# Patient Record
Sex: Male | Born: 1970 | Race: Black or African American | Hispanic: No | Smoking: Never smoker
Health system: Southern US, Community
[De-identification: ages and names within clinical notes are randomized; demographics above are authoritative.]

---

## 2014-08-07 ENCOUNTER — Encounter (HOSPITAL_COMMUNITY): Payer: Self-pay | Admitting: Emergency Medicine

## 2014-08-07 ENCOUNTER — Emergency Department (HOSPITAL_COMMUNITY)
Admission: EM | Admit: 2014-08-07 | Discharge: 2014-08-07 | Disposition: A | Discharge status: Home / Self Care | Payer: Self-pay | Attending: Emergency Medicine | Admitting: Emergency Medicine

## 2014-08-07 ENCOUNTER — Emergency Department (HOSPITAL_COMMUNITY): Payer: Self-pay

## 2014-08-07 DIAGNOSIS — R0789 Other chest pain: Secondary | ICD-10-CM | POA: Insufficient documentation

## 2014-08-07 DIAGNOSIS — R079 Chest pain, unspecified: Secondary | ICD-10-CM

## 2014-08-07 DIAGNOSIS — R55 Syncope and collapse: Secondary | ICD-10-CM | POA: Insufficient documentation

## 2014-08-07 LAB — BASIC METABOLIC PANEL
Anion gap: 11 (ref 5–15)
BUN: 11 mg/dL (ref 6–23)
CHLORIDE: 107 mmol/L (ref 96–112)
CO2: 27 mmol/L (ref 19–32)
Calcium: 9.2 mg/dL (ref 8.4–10.5)
Creatinine, Ser: 0.96 mg/dL (ref 0.50–1.35)
GFR calc Af Amer: >90 mL/min (ref >90)
GFR calc non Af Amer: >90 mL/min (ref >90)
Glucose, Bld: 104 mg/dL — ABNORMAL HIGH (ref 70–99)
SODIUM: 145 mmol/L (ref 135–145)

## 2014-08-07 LAB — CBC WITH DIFFERENTIAL/PLATELET
BASOS ABS: 0 10*3/uL (ref 0.0–0.1)
BASOS PCT: 0 % (ref 0–1)
EOS ABS: 0.1 10*3/uL (ref 0.0–0.7)
Eosinophils Relative: 1 % (ref 0–5)
HCT: 45 % (ref 39.0–52.0)
Hemoglobin: 14.9 g/dL (ref 13.0–17.0)
LYMPHS ABS: 1.6 10*3/uL (ref 0.7–4.0)
Lymphocytes Relative: 21 % (ref 12–46)
MCH: 28.4 pg (ref 26.0–34.0)
MCHC: 33.1 g/dL (ref 30.0–36.0)
MCV: 85.7 fL (ref 78.0–100.0)
MONO ABS: 0.3 10*3/uL (ref 0.1–1.0)
Monocytes Relative: 4 % (ref 3–12)
Neutro Abs: 5.7 10*3/uL (ref 1.7–7.7)
Neutrophils Relative %: 74 % (ref 43–77)
Platelets: 321 10*3/uL (ref 150–400)
RBC: 5.25 MIL/uL (ref 4.22–5.81)
RDW: 13.9 % (ref 11.5–15.5)
WBC: 7.7 10*3/uL (ref 4.0–10.5)

## 2014-08-07 LAB — I-STAT TROPONIN, ED
Troponin i, poc: 0.01 ng/mL (ref 0.00–0.08)
Troponin i, poc: 0 ng/mL (ref 0.00–0.08)

## 2014-08-07 LAB — I-STAT CHEM 8, ED
BUN: 10 mg/dL (ref 6–23)
CALCIUM ION: 1.13 mmol/L (ref 1.12–1.23)
CHLORIDE: 103 mmol/L (ref 96–112)
CREATININE: 1.3 mg/dL (ref 0.50–1.35)
Glucose, Bld: 110 mg/dL — ABNORMAL HIGH (ref 70–99)
HCT: 50 % (ref 39.0–52.0)
HEMOGLOBIN: 17 g/dL (ref 13.0–17.0)
Potassium: 3.6 mmol/L (ref 3.5–5.1)
SODIUM: 143 mmol/L (ref 135–145)
TCO2: 23 mmol/L (ref 0–100)

## 2014-08-07 LAB — CBG MONITORING, ED: Glucose-Capillary: 115 mg/dL — ABNORMAL HIGH (ref 70–99)

## 2014-08-07 LAB — MAGNESIUM: Magnesium: 2.1 mg/dL (ref 1.5–2.5)

## 2014-08-07 MED ORDER — SODIUM CHLORIDE 0.9 % IV BOLUS (SEPSIS)
1000.0000 mL | Freq: Once | INTRAVENOUS | Status: AC
Start: 2014-08-07 — End: 2014-08-07
  Administered 2014-08-07: 1000 mL via INTRAVENOUS

## 2014-08-07 NOTE — ED Notes (Signed)
Patient transported to X-ray 

## 2014-08-07 NOTE — ED Notes (Signed)
Pt c/o dizziness, lightheadedness, diaphoresis. Denies syncope.

## 2014-08-07 NOTE — Discharge Instructions (Signed)
Chest Pain (Nonspecific) °It is often hard to give a specific diagnosis for the cause of chest pain. There is always a chance that your pain could be related to something serious, such as a heart attack or a blood clot in the lungs. You need to follow up with your health care provider for further evaluation. °CAUSES  °· Heartburn. °· Pneumonia or bronchitis. °· Anxiety or stress. °· Inflammation around your heart (pericarditis) or lung (pleuritis or pleurisy). °· A blood clot in the lung. °· A collapsed lung (pneumothorax). It can develop suddenly on its own (spontaneous pneumothorax) or from trauma to the chest. °· Shingles infection (herpes zoster virus). °The chest wall is composed of bones, muscles, and cartilage. Any of these can be the source of the pain. °· The bones can be bruised by injury. °· The muscles or cartilage can be strained by coughing or overwork. °· The cartilage can be affected by inflammation and become sore (costochondritis). °DIAGNOSIS  °Lab tests or other studies may be needed to find the cause of your pain. Your health care provider may have you take a test called an ambulatory electrocardiogram (ECG). An ECG records your heartbeat patterns over a 24-hour period. You may also have other tests, such as: °· Transthoracic echocardiogram (TTE). During echocardiography, sound waves are used to evaluate how blood flows through your heart. °· Transesophageal echocardiogram (TEE). °· Cardiac monitoring. This allows your health care provider to monitor your heart rate and rhythm in real time. °· Holter monitor. This is a portable device that records your heartbeat and can help diagnose heart arrhythmias. It allows your health care provider to track your heart activity for several days, if needed. °· Stress tests by exercise or by giving medicine that makes the heart beat faster. °TREATMENT  °· Treatment depends on what may be causing your chest pain. Treatment may include: °¨ Acid blockers for  heartburn. °¨ Anti-inflammatory medicine. °¨ Pain medicine for inflammatory conditions. °¨ Antibiotics if an infection is present. °· You may be advised to change lifestyle habits. This includes stopping smoking and avoiding alcohol, caffeine, and chocolate. °· You may be advised to keep your head raised (elevated) when sleeping. This reduces the chance of acid going backward from your stomach into your esophagus. °Most of the time, nonspecific chest pain will improve within 2-3 days with rest and mild pain medicine.  °HOME CARE INSTRUCTIONS  °· If antibiotics were prescribed, take them as directed. Finish them even if you start to feel better. °· For the next few days, avoid physical activities that bring on chest pain. Continue physical activities as directed. °· Do not use any tobacco products, including cigarettes, chewing tobacco, or electronic cigarettes. °· Avoid drinking alcohol. °· Only take medicine as directed by your health care provider. °· Follow your health care provider's suggestions for further testing if your chest pain does not go away. °· Keep any follow-up appointments you made. If you do not go to an appointment, you could develop lasting (chronic) problems with pain. If there is any problem keeping an appointment, call to reschedule. °SEEK MEDICAL CARE IF:  °· Your chest pain does not go away, even after treatment. °· You have a rash with blisters on your chest. °· You have a fever. °SEEK IMMEDIATE MEDICAL CARE IF:  °· You have increased chest pain or pain that spreads to your arm, neck, jaw, back, or abdomen. °· You have shortness of breath. °· You have an increasing cough, or you cough   up blood. °· You have severe back or abdominal pain. °· You feel nauseous or vomit. °· You have severe weakness. °· You faint. °· You have chills. °This is an emergency. Do not wait to see if the pain will go away. Get medical help at once. Call your local emergency services (911 in [place] Do not drive  yourself to the hospital. °MAKE SURE YOU:  °· Understand these instructions. °· Will watch your condition. °· Will get help right away if you are not doing well or get worse. °Document Released: 04/02/2005 Document Revised: 06/28/2013 Document Reviewed: 01/27/2008 °[employer]® Patient Information ©2015 [employer]. This information is not intended to replace advice given to you by your health care provider. Make sure you discuss any questions you have with your health care provider. ° °Near-Syncope °Near-syncope (commonly known as near fainting) is sudden weakness, dizziness, or feeling like you might pass out. During an episode of near-syncope, you may also develop pale skin, have tunnel vision, or feel sick to your stomach (nauseous). Near-syncope may occur when getting up after sitting or while standing for a long time. It is caused by a sudden decrease in blood flow to the brain. This decrease can result from various causes or triggers, most of which are not serious. However, because near-syncope can sometimes be a sign of something serious, a medical evaluation is required. The specific cause is often not determined. °HOME CARE INSTRUCTIONS  °Monitor your condition for any changes. The following actions may help to alleviate any discomfort you are experiencing: °· Have someone stay with you until you feel stable. °· Lie down right away and prop your feet up if you start feeling like you might faint. Breathe deeply and steadily. Wait until all the symptoms have passed. Most of these episodes last only a few minutes. You may feel tired for several hours.   °· Drink enough fluids to keep your urine clear or pale yellow.   °· If you are taking blood pressure or heart medicine, get up slowly when seated or lying down. Take several minutes to sit and then stand. This can reduce dizziness. °· Follow up with your health care provider as directed.  °SEEK IMMEDIATE MEDICAL CARE IF:  °· You have a severe headache.    °· You have unusual pain in the chest, abdomen, or back.   °· You are bleeding from the mouth or rectum, or you have black or tarry stool.   °· You have an irregular or very fast heartbeat.   °· You have repeated fainting or have seizure-like jerking during an episode.   °· You faint when sitting or lying down.   °· You have confusion.   °· You have difficulty walking.   °· You have severe weakness.   °· You have vision problems.   °MAKE SURE YOU:  °· Understand these instructions. °· Will watch your condition. °· Will get help right away if you are not doing well or get worse. °Document Released: 06/23/2005 Document Revised: 06/28/2013 Document Reviewed: 11/26/2012 °Exit[employer]® Patient Information ©2015 [employer]. This information is not intended to replace advice given to you by your health care provider. Make sure you discuss any questions you have with your health care provider. ° °

## 2014-08-07 NOTE — ED Provider Notes (Signed)
TIME SEEN: 5:20 PM  CHIEF COMPLAINT: Lightheadedness, diaphoresis, chest pain, near-syncope  HPI: Pt is a 44 y.o. male with no significant past medical history who presents to the emergency department with complaints of episode of feeling very lightheaded while at work and then developed diaphoresis, sharp chest pain, chest tightness and feeling he was on a pass out but did not pass out. Denies shortness of breath, no nausea, vomiting or diarrhea. No recent bloody stool or melena. States he is feeling normal nail. Has had this happen to him twice before. He states he has had a syncopal event in the past. Does not have a PCP or cardiologist that he follows up with. No history of PE or DVT, prolonged immobilization such as long flight or hospitals physician, fracture, surgery, trauma. No history of hypertension, diabetes, hyperlipidemia, tobacco use. No family history of premature CAD. Does have a grandfather that had an MI in his 7470s.  ROS: See HPI Constitutional: no fever  Eyes: no drainage  ENT: no runny nose   Cardiovascular:  no chest pain  Resp: no SOB  GI: no vomiting GU: no dysuria Integumentary: no rash  Allergy: no hives  Musculoskeletal: no leg swelling  Neurological: no slurred speech ROS otherwise negative  PAST MEDICAL HISTORY/PAST SURGICAL HISTORY:  History reviewed. No pertinent past medical history.  MEDICATIONS:  Prior to Admission medications   Not on File    ALLERGIES:  No Known Allergies  SOCIAL HISTORY:  History  Substance Use Topics  . Smoking status: Never Smoker   . Smokeless tobacco: Not on file  . Alcohol Use: 1.2 oz/week    2 Cans of beer per week    FAMILY HISTORY: History reviewed. No pertinent family history.  EXAM: BP 122/75 mmHg  Pulse 79  Temp(Src) 97.7 F (36.5 C) (Oral)  Resp 16  SpO2 96% CONSTITUTIONAL: Alert and oriented and responds appropriately to questions. Well-appearing; well-nourished HEAD: Normocephalic EYES: Conjunctivae  clear, PERRL ENT: normal nose; no rhinorrhea; moist mucous membranes; pharynx without lesions noted NECK: Supple, no meningismus, no LAD  CARD: RRR; S1 and S2 appreciated; no murmurs, no clicks, no rubs, no gallops RESP: Normal chest excursion without splinting or tachypnea; breath sounds clear and equal bilaterally; no wheezes, no rhonchi, no rales, no hypoxia or respiratory distress ABD/GI: Normal bowel sounds; non-distended; soft, non-tender, no rebound, no guarding BACK:  The back appears normal and is non-tender to palpation, there is no CVA tenderness EXT: Normal ROM in all joints; non-tender to palpation; no edema; normal capillary refill; no cyanosis, no calf tenderness or swelling SKIN: Normal color for age and race; warm NEURO: Moves all extremities equally; sensation to light touch intact diffusely, cranial nerves II through XII intact, normal gait PSYCH: The patient's mood and manner are appropriate. Grooming and personal hygiene are appropriate.  MEDICAL DECISION MAKING: Patient here with near syncopal event. He has no risk factors for ACS other than a grandfather who had an MI in his 5670s. No risk factors for pulmonary embolus and is PERC negative.  We'll obtain basic labs, troponin, EKG, chest x-ray. We'll check orthostatic vital signs and give IV fluids.  ED PROGRESS: Patient's labs are unremarkable. Orthostatic vital signs negative. Reports feeling better after IV fluids. No further chest pain while in the emergency department. Has had 2 negative troponins. Has no risk factors for ACS but does have an EKG that has T-wave inversions in inferior leads with no old for comparison. Have recommended close outpatient follow-up with cardiology.  Discussed return options. He verbalizes understanding and is comfortable with plan. We'll discharge with cardiology follow-up information. Have advised him to make an appointment.      EKG Interpretation  Date/Time:  Monday August 07 2014  15:39:44 EST Ventricular Rate:  78 PR Interval:  140 QRS Duration: 81 QT Interval:  364 QTC Calculation: 415 R Axis:   41 Text Interpretation:  Sinus rhythm Borderline T abnormalities, inferior leads No old tracing to compare Confirmed by WARD,  DO, KRISTEN [phone]) on 08/07/2014 3:47:51 PM         Layla Maw Ward, DO 08/07/14 2023

## 2014-08-08 ENCOUNTER — Other Ambulatory Visit (HOSPITAL_COMMUNITY): Payer: Self-pay | Admitting: *Deleted

## 2015-01-04 ENCOUNTER — Emergency Department (HOSPITAL_COMMUNITY)
Admission: EM | Admit: 2015-01-04 | Discharge: 2015-01-05 | Disposition: A | Discharge status: Home / Self Care | Payer: Self-pay | Attending: Emergency Medicine | Admitting: Emergency Medicine

## 2015-01-04 ENCOUNTER — Emergency Department (HOSPITAL_COMMUNITY): Payer: Self-pay

## 2015-01-04 ENCOUNTER — Encounter (HOSPITAL_COMMUNITY): Payer: Self-pay | Admitting: Emergency Medicine

## 2015-01-04 DIAGNOSIS — Z79899 Other long term (current) drug therapy: Secondary | ICD-10-CM | POA: Insufficient documentation

## 2015-01-04 DIAGNOSIS — J069 Acute upper respiratory infection, unspecified: Secondary | ICD-10-CM | POA: Insufficient documentation

## 2015-01-04 DIAGNOSIS — R197 Diarrhea, unspecified: Secondary | ICD-10-CM | POA: Insufficient documentation

## 2015-01-04 DIAGNOSIS — R1110 Vomiting, unspecified: Secondary | ICD-10-CM | POA: Insufficient documentation

## 2015-01-04 LAB — BASIC METABOLIC PANEL
Anion gap: 9 (ref 5–15)
BUN: 7 mg/dL (ref 6–20)
CALCIUM: 9.7 mg/dL (ref 8.9–10.3)
CHLORIDE: 101 mmol/L (ref 101–111)
CO2: 28 mmol/L (ref 22–32)
Creatinine, Ser: 0.95 mg/dL (ref 0.61–1.24)
Glucose, Bld: 129 mg/dL — ABNORMAL HIGH (ref 65–99)
POTASSIUM: 4.3 mmol/L (ref 3.5–5.1)
Sodium: 138 mmol/L (ref 135–145)

## 2015-01-04 LAB — CBC WITH DIFFERENTIAL/PLATELET
Basophils Absolute: 0 10*3/uL (ref 0.0–0.1)
Basophils Relative: 0 % (ref 0–1)
EOS ABS: 0.2 10*3/uL (ref 0.0–0.7)
EOS PCT: 2 % (ref 0–5)
HCT: 47.6 % (ref 39.0–52.0)
HEMOGLOBIN: 15.6 g/dL (ref 13.0–17.0)
Lymphocytes Relative: 20 % (ref 12–46)
Lymphs Abs: 1.7 10*3/uL (ref 0.7–4.0)
MCH: 27.8 pg (ref 26.0–34.0)
MCHC: 32.8 g/dL (ref 30.0–36.0)
MCV: 84.8 fL (ref 78.0–100.0)
MONO ABS: 0.6 10*3/uL (ref 0.1–1.0)
Monocytes Relative: 6 % (ref 3–12)
NEUTROS ABS: 6.2 10*3/uL (ref 1.7–7.7)
Neutrophils Relative %: 72 % (ref 43–77)
Platelets: 289 10*3/uL (ref 150–400)
RBC: 5.61 MIL/uL (ref 4.22–5.81)
RDW: 13.6 % (ref 11.5–15.5)
WBC: 8.7 10*3/uL (ref 4.0–10.5)

## 2015-01-04 MED ORDER — CETIRIZINE HCL 10 MG PO TABS: 10.0000 mg | ORAL_TABLET | Freq: Every day | ORAL | Status: AC

## 2015-01-04 MED ORDER — FLUTICASONE PROPIONATE 50 MCG/ACT NA SUSP: 2.0000 | Freq: Every day | NASAL | Status: AC

## 2015-01-04 MED ORDER — ALBUTEROL SULFATE HFA 108 (90 BASE) MCG/ACT IN AERS
2.0000 | INHALATION_SPRAY | Freq: Once | RESPIRATORY_TRACT | Status: AC
  Administered 2015-01-04: 2 via RESPIRATORY_TRACT
  Filled 2015-01-04: qty 6.7

## 2015-01-04 MED ORDER — ACETAMINOPHEN 325 MG PO TABS
650.0000 mg | ORAL_TABLET | Freq: Once | ORAL | Status: AC
  Administered 2015-01-04: 650 mg via ORAL
  Filled 2015-01-04: qty 2

## 2015-01-04 MED ORDER — NAPROXEN 250 MG PO TABS: 250.0000 mg | ORAL_TABLET | Freq: Two times a day (BID) | ORAL | Status: AC

## 2015-01-04 MED ORDER — BENZONATATE 100 MG PO CAPS: 100.0000 mg | ORAL_CAPSULE | Freq: Three times a day (TID) | ORAL | Status: DC

## 2015-01-04 NOTE — ED Notes (Signed)
PA at bedside.

## 2015-01-04 NOTE — ED Provider Notes (Signed)
CSN: [id number]     Arrival date & time 01/04/15  1823 History   First MD Initiated Contact with Patient 01/04/15 2105     Chief Complaint  Patient presents with  . Shortness of Breath  . Cough  . Emesis   Nathan Garrett is a 44 y.o. male who is otherwise healthy who presents to the ED complaining of a cough ongoing for the past 3 days associated with mild shortness of breath. Patient reports productive cough with clear sputum. He reports associated nasal congestion, postnasal drip, runny nose, and sneezing.  He reports 3 episodes of posttussive emesis today. He also reports one episode of watery diarrhea this morning but none since. He denies abdominal pain. He reports is a headache that he rates a the 5 out of 10 and is worse with coughing. He reports taking DayQuil this morning with little relief. The patient denies history of COPD, chronic bronchitis or emphysema. The patient is not a smoker. The patient denies fevers, history of asthma, abdominal pain, nausea, hematemesis, hemoptysis, changes to his vision, rashes, urinary symptoms, sick contacts, ear pain, eye pain, hematochezia, chest pain or palpitations.   (Consider location/radiation/quality/duration/timing/severity/associated sxs/prior Treatment) HPI  History reviewed. No pertinent past medical history. History reviewed. No pertinent past surgical history. History reviewed. No pertinent family history. History  Substance Use Topics  . Smoking status: Never Smoker   . Smokeless tobacco: Not on file  . Alcohol Use: 1.2 oz/week    2 Cans of beer per week    Review of Systems  Constitutional: Positive for chills. Negative for fever.  HENT: Positive for rhinorrhea, sinus pressure and sneezing. Negative for congestion, ear pain, mouth sores, sore throat and trouble swallowing.   Eyes: Negative for pain and visual disturbance.  Respiratory: Positive for cough and shortness of breath. Negative for wheezing.   Cardiovascular:  Negative for chest pain, palpitations and leg swelling.  Gastrointestinal: Positive for vomiting and diarrhea. Negative for nausea, abdominal pain and blood in stool.  Genitourinary: Negative for dysuria, urgency, frequency, hematuria and difficulty urinating.  Musculoskeletal: Negative for myalgias, back pain and neck pain.  Skin: Negative for rash.  Neurological: Positive for headaches. Negative for dizziness, syncope, weakness, light-headedness and numbness.      Allergies  Review of patient's allergies indicates no known allergies.  Home Medications   Prior to Admission medications   Medication Sig Start Date End Date Taking? Authorizing Provider  albuterol (PROVENTIL HFA;VENTOLIN HFA) 108 (90 BASE) MCG/ACT inhaler Inhale 1-2 puffs into the lungs every 6 (six) hours as needed for wheezing or shortness of breath.   Yes Historical Provider, MD  Dextromethorphan HBr (VICKS DAYQUIL COUGH) 15 MG/15ML LIQD Take 30 mLs by mouth every 6 (six) hours as needed (cough).   Yes Historical Provider, MD  benzonatate (TESSALON) 100 MG capsule Take 1 capsule (100 mg total) by mouth every 8 (eight) hours. 01/04/15   Everlene Farrier, PA-C  cetirizine (ZYRTEC ALLERGY) 10 MG tablet Take 1 tablet (10 mg total) by mouth daily. 01/04/15   Everlene Farrier, PA-C  fluticasone (FLONASE) 50 MCG/ACT nasal spray Place 2 sprays into both nostrils daily. 01/04/15   Everlene Farrier, PA-C  naproxen (NAPROSYN) 250 MG tablet Take 1 tablet (250 mg total) by mouth 2 (two) times daily with a meal. 01/04/15   Everlene Farrier, PA-C   BP 155/98 mmHg  Pulse 64  Temp(Src) 98 F (36.7 C) (Oral)  Resp 18  SpO2 99% Physical Exam  Constitutional: He appears well-developed and  well-nourished. No distress.  Nontoxic appearing.  HENT:  Head: Normocephalic and atraumatic.  Right Ear: External ear normal.  Left Ear: External ear normal.  Nose: Nose normal.  Mouth/Throat: Oropharynx is clear and moist. No oropharyngeal exudate.   Bilateral tympanic membranes are pearly-gray without erythema or loss of landmarks. Mild restrictive oropharyngeal erythema. No tonsillar hypertrophy or exudates. Mild bilateral frontal sinus tenderness to palpation. No maxillary sinus tenderness.  Eyes: Conjunctivae are normal. Pupils are equal, round, and reactive to light. Right eye exhibits no discharge. Left eye exhibits no discharge.  Neck: Normal range of motion. Neck supple. No JVD present. No tracheal deviation present.  Cardiovascular: Normal rate, regular rhythm, normal heart sounds and intact distal pulses.  Exam reveals no gallop and no friction rub.   No murmur heard. Bilateral radial, posterior tibialis and dorsalis pedis pulses are intact.    Pulmonary/Chest: Effort normal and breath sounds normal. No respiratory distress. He has no wheezes. He has no rales. He exhibits no tenderness.  Lungs are clear to auscultation bilaterally.  Abdominal: Soft. Bowel sounds are normal. He exhibits no distension. There is no tenderness. There is no rebound and no guarding.  Abdomen is soft and nontender to palpation.  Musculoskeletal: He exhibits no edema or tenderness.  No lower extremity edema or tenderness.  Lymphadenopathy:    He has no cervical adenopathy.  Neurological: He is alert. Coordination normal.  Skin: Skin is warm and dry. No rash noted. He is not diaphoretic. No erythema. No pallor.  Psychiatric: He has a normal mood and affect. His behavior is normal.  Nursing note and vitals reviewed.   ED Course  Procedures (including critical care time) Labs Review Labs Reviewed  BASIC METABOLIC PANEL - Abnormal; Notable for the following:    Glucose, Bld 129 (*)    All other components within normal limits  CBC WITH DIFFERENTIAL/PLATELET    Imaging Review Dg Chest 2 View  01/04/2015   CLINICAL DATA:  Cough and shortness of breath  EXAM: CHEST  2 VIEW  COMPARISON:  None.  FINDINGS: The heart size and mediastinal contours are  within normal limits. Both lungs are clear. The visualized skeletal structures are unremarkable.  IMPRESSION: No active cardiopulmonary disease.   Electronically Signed   By: Signa Kell M.D.   On: 01/04/2015 21:28     EKG Interpretation   Date/Time:  Thursday January 04 2015 18:32:43 EDT Ventricular Rate:  78 PR Interval:  115 QRS Duration: 82 QT Interval:  357 QTC Calculation: 407 R Axis:   27 Text Interpretation:  Sinus rhythm Borderline short PR interval Abnormal  T, consider ischemia, diffuse leads Baseline wander in lead(s) I II III  aVL aVF artifact limits evaluation Confirmed by Bebe Shaggy  MD, DONALD  [phone]) on 01/04/2015 8:16:58 PM     Filed Vitals:   01/04/15 1830 01/04/15 2108 01/04/15 2231  BP: 161/96 163/107 155/98  Pulse: 98 71 64  Temp: 98 F (36.7 C) 97.9 F (36.6 C) 98 F (36.7 C)  TempSrc: Oral Oral Oral  Resp: SpO2: 99% 99% 99%    MDM   Meds given in ED:  Medications  albuterol (PROVENTIL HFA;VENTOLIN HFA) 108 (90 BASE) MCG/ACT inhaler 2 puff (2 puffs Inhalation Given 01/04/15 2158)  acetaminophen (TYLENOL) tablet 650 mg (650 mg Oral Given 01/04/15 2157)    New Prescriptions   BENZONATATE (TESSALON) 100 MG CAPSULE    Take 1 capsule (100 mg total) by mouth every 8 (eight) hours.  CETIRIZINE (ZYRTEC ALLERGY) 10 MG TABLET    Take 1 tablet (10 mg total) by mouth daily.   FLUTICASONE (FLONASE) 50 MCG/ACT NASAL SPRAY    Place 2 sprays into both nostrils daily.   NAPROXEN (NAPROSYN) 250 MG TABLET    Take 1 tablet (250 mg total) by mouth 2 (two) times daily with a meal.    Final diagnoses:  Upper respiratory infection   Pt CXR negative for acute infiltrate. Patients symptoms are consistent with URI, likely viral etiology. Discussed that antibiotics are not indicated for viral infections. Pt will be discharged with symptomatic treatment.  Patient was given albuterol inhaler in the ED and Tylenol and he reports feeling much better at discharge. He  reports feeling ready to be discharged. Verbalizes understanding and is agreeable with plan. Pt is hemodynamically stable & in NAD prior to dc. I advised the patient to follow-up with their primary care provider this week. I advised the patient to return to the emergency department with new or worsening symptoms or new concerns. The patient verbalized understanding and agreement with plan.    Everlene FarrierWilliam Nahum Sherrer, PA-C 01/05/15 [phone]  Blake DivineJohn Wofford, MD 01/05/15 [phone]

## 2015-01-04 NOTE — ED Notes (Addendum)
Pt reports coughing with associated SOB x2 days. Reports clear sputum. Had a "cold last month" -thought this had resolved. Emesis x2 days. Unsure if he has had a fever. Endorses diarrhea. No other c/c. Has tried Dayquil and Nyquil with no alleviation. Took Dayquil today around 1200.

## 2015-01-04 NOTE — Discharge Instructions (Signed)
Upper Respiratory Infection, Adult °An upper respiratory infection (URI) is also sometimes known as the common cold. The upper respiratory tract includes the nose, sinuses, throat, trachea, and bronchi. Bronchi are the airways leading to the lungs. Most people improve within 1 week, but symptoms can last up to 2 weeks. A residual cough may last even longer.  °CAUSES °Many different viruses can infect the tissues lining the upper respiratory tract. The tissues become irritated and inflamed and often become very moist. Mucus production is also common. A cold is contagious. You can easily spread the virus to others by oral contact. This includes kissing, sharing a glass, coughing, or sneezing. Touching your mouth or nose and then touching a surface, which is then touched by another person, can also spread the virus. °SYMPTOMS  °Symptoms typically develop 1 to 3 days after you come in contact with a cold virus. Symptoms vary from person to person. They may include: °· Runny nose. °· Sneezing. °· Nasal congestion. °· Sinus irritation. °· Sore throat. °· Loss of voice (laryngitis). °· Cough. °· Fatigue. °· Muscle aches. °· Loss of appetite. °· Headache. °· Low-grade fever. °DIAGNOSIS  °You might diagnose your own cold based on familiar symptoms, since most people get a cold 2 to 3 times a year. Your caregiver can confirm this based on your exam. Most importantly, your caregiver can check that your symptoms are not due to another disease such as strep throat, sinusitis, pneumonia, asthma, or epiglottitis. Blood tests, throat tests, and X-rays are not necessary to diagnose a common cold, but they may sometimes be helpful in excluding other more serious diseases. Your caregiver will decide if any further tests are required. °RISKS AND COMPLICATIONS  °You may be at risk for a more severe case of the common cold if you smoke cigarettes, have chronic heart disease (such as heart failure) or lung disease (such as asthma), or if  you have a weakened immune system. The very young and very old are also at risk for more serious infections. Bacterial sinusitis, middle ear infections, and bacterial pneumonia can complicate the common cold. The common cold can worsen asthma and chronic obstructive pulmonary disease (COPD). Sometimes, these complications can require emergency medical care and may be life-threatening. °PREVENTION  °The best way to protect against getting a cold is to practice good hygiene. Avoid oral or hand contact with people with cold symptoms. Wash your hands often if contact occurs. There is no clear evidence that vitamin C, vitamin E, echinacea, or exercise reduces the chance of developing a cold. However, it is always recommended to get plenty of rest and practice good nutrition. °TREATMENT  °Treatment is directed at relieving symptoms. There is no cure. Antibiotics are not effective, because the infection is caused by a virus, not by bacteria. Treatment may include: °· Increased fluid intake. Sports drinks offer valuable electrolytes, sugars, and fluids. °· Breathing heated mist or steam (vaporizer or shower). °· Eating chicken soup or other clear broths, and maintaining good nutrition. °· Getting plenty of rest. °· Using gargles or lozenges for comfort. °· Controlling fevers with ibuprofen or acetaminophen as directed by your caregiver. °· Increasing usage of your inhaler if you have asthma. °Zinc gel and zinc lozenges, taken in the first 24 hours of the common cold, can shorten the duration and lessen the severity of symptoms. Pain medicines may help with fever, muscle aches, and throat pain. A variety of non-prescription medicines are available to treat congestion and runny nose. Your caregiver   can make recommendations and may suggest nasal or lung inhalers for other symptoms.  HOME CARE INSTRUCTIONS   Only take over-the-counter or prescription medicines for pain, discomfort, or fever as directed by your  caregiver.  Use a warm mist humidifier or inhale steam from a shower to increase air moisture. This may keep secretions moist and make it easier to breathe.  Drink enough water and fluids to keep your urine clear or pale yellow.  Rest as needed.  Return to work when your temperature has returned to normal or as your caregiver advises. You may need to stay home longer to avoid infecting others. You can also use a face mask and careful hand washing to prevent spread of the virus. SEEK MEDICAL CARE IF:   After the first few days, you feel you are getting worse rather than better.  You need your caregiver's advice about medicines to control symptoms.  You develop chills, worsening shortness of breath, or brown or red sputum. These may be signs of pneumonia.  You develop yellow or brown nasal discharge or pain in the face, especially when you bend forward. These may be signs of sinusitis.  You develop a fever, swollen neck glands, pain with swallowing, or white areas in the back of your throat. These may be signs of strep throat. SEEK IMMEDIATE MEDICAL CARE IF:   You have a fever.  You develop severe or persistent headache, ear pain, sinus pain, or chest pain.  You develop wheezing, a prolonged cough, cough up blood, or have a change in your usual mucus (if you have chronic lung disease).  You develop sore muscles or a stiff neck. Document Released: 12/17/2000 Document Revised: 09/15/2011 Document Reviewed: 09/28/2013 [facility] Patient Information 2015 [place], [place]. This information is not intended to replace advice given to you by your health care provider. Make sure you discuss any questions you have with your health care provider. Diarrhea Diarrhea is frequent loose and watery bowel movements. It can cause you to feel weak and dehydrated. Dehydration can cause you to become tired and thirsty, have a dry mouth, and have decreased urination that often is dark yellow. Diarrhea is a sign  of another problem, most often an infection that will not last long. In most cases, diarrhea typically lasts 2-3 days. However, it can last longer if it is a sign of something more serious. It is important to treat your diarrhea as directed by your caregiver to lessen or prevent future episodes of diarrhea. CAUSES  Some common causes include:  Gastrointestinal infections caused by viruses, bacteria, or parasites.  Food poisoning or food allergies.  Certain medicines, such as antibiotics, chemotherapy, and laxatives.  Artificial sweeteners and fructose.  Digestive disorders. HOME CARE INSTRUCTIONS  Ensure adequate fluid intake (hydration): Have 1 cup (8 oz) of fluid for each diarrhea episode. Avoid fluids that contain simple sugars or sports drinks, fruit juices, whole milk products, and sodas. Your urine should be clear or pale yellow if you are drinking enough fluids. Hydrate with an oral rehydration solution that you can purchase at pharmacies, retail stores, and online. You can prepare an oral rehydration solution at home by mixing the following ingredients together:   - tsp table salt.   tsp baking soda.   tsp salt substitute containing potassium chloride.  1  tablespoons sugar.  1 L (34 oz) of water.  Certain foods and beverages may increase the speed at which food moves through the gastrointestinal (GI) tract. These foods and beverages should  be avoided and include:  Caffeinated and alcoholic beverages.  High-fiber foods, such as raw fruits and vegetables, nuts, seeds, and whole grain breads and cereals.  Foods and beverages sweetened with sugar alcohols, such as xylitol, sorbitol, and mannitol.  Some foods may be well tolerated and may help thicken stool including:  Starchy foods, such as rice, toast, pasta, low-sugar cereal, oatmeal, grits, baked potatoes, crackers, and bagels.  Bananas.  Applesauce.  Add probiotic-rich foods to help increase healthy bacteria in  the GI tract, such as yogurt and fermented milk products.  Wash your hands well after each diarrhea episode.  Only take over-the-counter or prescription medicines as directed by your caregiver.  Take a warm bath to relieve any burning or pain from frequent diarrhea episodes. SEEK IMMEDIATE MEDICAL CARE IF:   You are unable to keep fluids down.  You have persistent vomiting.  You have blood in your stool, or your stools are black and tarry.  You do not urinate in 6-8 hours, or there is only a small amount of very dark urine.  You have abdominal pain that increases or localizes.  You have weakness, dizziness, confusion, or light-headedness.  You have a severe headache.  Your diarrhea gets worse or does not get better.  You have a fever or persistent symptoms for more than 2-3 days.  You have a fever and your symptoms suddenly get worse. MAKE SURE YOU:   Understand these instructions.  Will watch your condition.  Will get help right away if you are not doing well or get worse. Document Released: 06/13/2002 Document Revised: 11/07/2013 Document Reviewed: 02/29/2012 [facility] Patient Information 2015 [place], [place]. This information is not intended to replace advice given to you by your health care provider. Make sure you discuss any questions you have with your health care provider.

## 2016-02-12 ENCOUNTER — Encounter (HOSPITAL_COMMUNITY): Payer: Self-pay | Admitting: Emergency Medicine

## 2016-02-12 ENCOUNTER — Emergency Department (HOSPITAL_COMMUNITY)
Admission: EM | Admit: 2016-02-12 | Discharge: 2016-02-12 | Disposition: A | Discharge status: Home / Self Care | Payer: Self-pay | Attending: Emergency Medicine | Admitting: Emergency Medicine

## 2016-02-12 DIAGNOSIS — Z79899 Other long term (current) drug therapy: Secondary | ICD-10-CM | POA: Insufficient documentation

## 2016-02-12 DIAGNOSIS — L237 Allergic contact dermatitis due to plants, except food: Secondary | ICD-10-CM | POA: Insufficient documentation

## 2016-02-12 MED ORDER — PREDNISONE 10 MG (21) PO TBPK: 10.0000 mg | ORAL_TABLET | Freq: Every day | ORAL | 0 refills | Status: DC

## 2016-02-12 MED ORDER — HYDROXYZINE HCL 25 MG PO TABS: 25.0000 mg | ORAL_TABLET | Freq: Four times a day (QID) | ORAL | 0 refills | Status: AC

## 2016-02-12 MED ORDER — HYDROXYZINE HCL 10 MG PO TABS
10.0000 mg | ORAL_TABLET | Freq: Once | ORAL | Status: AC
  Administered 2016-02-12: 10 mg via ORAL
  Filled 2016-02-12: qty 1

## 2016-02-12 NOTE — ED Triage Notes (Signed)
Pt c/o red itchy rash to right arm onset 2 days ago, states he did rub against a bush 2 days ago. No new soaps, detergents, clothing items.

## 2016-02-12 NOTE — ED Provider Notes (Signed)
[facility]-EMERGENCY DEPT Provider Note   CSN: [id number] Arrival date & time: 02/12/16  1542  First Provider Contact:  None    By signing my name below, I, Placido Sou, attest that this documentation has been prepared under the direction and in the presence of [employer], PA-C.  Electronically Signed: Placido Sou, ED Scribe. 02/12/16. 4:45 PM.     History   Chief Complaint Chief Complaint  Patient presents with  . Rash    HPI HPI Comments: Nathan Garrett is a 45 y.o. male who presents to the Emergency Department complaining of worsening, moderate, diffuse, rash to his RUE x 2 days. He states that he was in and around bushes in his yard prior to the onset of his symptoms. Pt states he cleaned the affected region and applied Neosporin w/o relief. Pt reports associated redness and itchiness across the affected region. Pt denies a PMHx of DM. He denies fevers, CP, SOB, abd pain and n/v.   The history is provided by the patient. No language interpreter was used.   History reviewed. No pertinent past medical history.  There are no active problems to display for this patient.   History reviewed. No pertinent surgical history.   Home Medications    Prior to Admission medications   Medication Sig Start Date End Date Taking? Authorizing Provider  cetirizine (ZYRTEC ALLERGY) 10 MG tablet Take 1 tablet (10 mg total) by mouth daily. Patient taking differently: Take 10 mg by mouth daily as needed for allergies.  01/04/15  Yes Everlene Farrier, PA-C  fluticasone (FLONASE) 50 MCG/ACT nasal spray Place 2 sprays into both nostrils daily. Patient not taking: Reported on 02/12/2016 01/04/15   Everlene Farrier, PA-C  hydrOXYzine (ATARAX/VISTARIL) 25 MG tablet Take 1 tablet (25 mg total) by mouth every 6 (six) hours. 02/12/16   Emi Holes, PA-C  naproxen (NAPROSYN) 250 MG tablet Take 1 tablet (250 mg total) by mouth 2 (two) times daily with a meal. Patient not taking: Reported on 02/12/2016  01/04/15   Everlene Farrier, PA-C  predniSONE (STERAPRED UNI-PAK 21 TAB) 10 MG (21) TBPK tablet Take 1 tablet (10 mg total) by mouth daily. Take 6 tabs by mouth daily  for 2 days, then 5 tabs for 2 days, then 4 tabs for 2 days, then 3 tabs for 2 days, 2 tabs for 2 days, then 1 tab by mouth daily for 2 days 02/12/16   Emi Holes, PA-C    Family History History reviewed. No pertinent family history.  Social History Social History  Substance Use Topics  . Smoking status: Never Smoker  . Smokeless tobacco: Not on file  . Alcohol use 1.2 oz/week    2 Cans of beer per week     Allergies   Review of patient's allergies indicates no known allergies.   Review of Systems Review of Systems  Constitutional: Negative for chills, fever and unexpected weight change.  HENT: Negative for facial swelling and sore throat.   Respiratory: Negative for shortness of breath.   Cardiovascular: Negative for chest pain.  Gastrointestinal: Negative for abdominal pain, nausea and vomiting.  Genitourinary: Negative for dysuria.  Musculoskeletal: Negative for back pain and myalgias.  Skin: Positive for color change and rash. Negative for wound.  Neurological: Negative for numbness and headaches.  Psychiatric/Behavioral: The patient is not nervous/anxious.    Physical Exam Updated Vital Signs BP 135/96   Pulse 74   Temp 98.1 F (36.7 C) (Oral)   Resp 18   Ht  5\' 2"  (1.575 m)   Wt 68 kg   SpO2 95%   BMI 27.44 kg/m   Physical Exam  Constitutional: He appears well-developed and well-nourished. No distress.  HENT:  Head: Normocephalic and atraumatic.  Eyes: Conjunctivae are normal. Pupils are equal, round, and reactive to light. Right eye exhibits no discharge. Left eye exhibits no discharge. No scleral icterus.  Neck: Normal range of motion. Neck supple. No thyromegaly present.  Cardiovascular: Normal rate, regular rhythm and normal heart sounds.  Exam reveals no gallop and no friction rub.   No  murmur heard. Pulmonary/Chest: Effort normal and breath sounds normal. No stridor. No respiratory distress. He has no wheezes. He has no rales.  Abdominal: Soft. Bowel sounds are normal. He exhibits no distension. There is no tenderness. There is no rebound and no guarding.  Musculoskeletal: He exhibits no edema.  Lymphadenopathy:    He has no cervical adenopathy.  Neurological: He is alert. Coordination normal.  Skin: Skin is warm and dry. He is not diaphoretic. There is erythema. No pallor.  See image for skin rash resembling contact dermatitis from poison ivy/oak  Psychiatric: He has a normal mood and affect.  Nursing note and vitals reviewed.    ED Treatments / Results  Labs (all labs ordered are listed, but only abnormal results are displayed) Labs Reviewed - No data to display  EKG  EKG Interpretation None       Radiology No results found.  Procedures Procedures  DIAGNOSTIC STUDIES: Oxygen Saturation is 94% on RA, adequate by my interpretation.    COORDINATION OF CARE: 4:43 PM Discussed next steps with pt. Pt verbalized understanding and is agreeable with the plan.   Medications Ordered in ED Medications  hydrOXYzine (ATARAX/VISTARIL) tablet 10 mg (10 mg Oral Given 02/12/16 1718)     Initial Impression / Assessment and Plan / ED Course  I have reviewed the triage vital signs and the nursing notes.  Pertinent labs & imaging results that were available during my care of the patient were reviewed by me and considered in my medical decision making (see chart for details).  Clinical Course   Poison Ivy Pt presentation consistant with poison ivy infection. Discussed contagiousness & home care. Treated in ED w atarax. DC with recommendation to get OTC Zanfel or Tecnu. Script for PO prednisone taper. Strict return precautions discussed. Pt afebrile and in NAD prior to dc. Airway intact without compromise. No facial or genital involvement of rash. Patient vitals stable  throughout ED course discharged in satisfactory condition.   I personally performed the services described in this documentation, which was scribed in my presence. The recorded information has been reviewed and is accurate.   Final Clinical Impressions(s) / ED Diagnoses   Final diagnoses:  Poison ivy dermatitis    New Prescriptions Discharge Medication List as of 02/12/2016  5:33 PM    START taking these medications   Details  hydrOXYzine (ATARAX/VISTARIL) 25 MG tablet Take 1 tablet (25 mg total) by mouth every 6 (six) hours., Starting Tue 02/12/2016, Print    predniSONE (STERAPRED UNI-PAK 21 TAB) 10 MG (21) TBPK tablet Take 1 tablet (10 mg total) by mouth daily. Take 6 tabs by mouth daily  for 2 days, then 5 tabs for 2 days, then 4 tabs for 2 days, then 3 tabs for 2 days, 2 tabs for 2 days, then 1 tab by mouth daily for 2 days, Starting Tue 02/12/2016, [employer]         Lynett Brasil  Rosaria Ferries, PA-C 02/12/16 1942    Shaune Pollack, MD 02/13/16 [phone]

## 2016-02-12 NOTE — Discharge Instructions (Signed)
Medications: Prednisone, hydroxyzine  Treatment: Take prednisone as prescribed for 12 days. Take hydroxyzine every 6 hours as needed for itching. Find the medication, Tecnu, at the pharmacy, or asked the pharmacist for assistance in finding something similar. Use as prescribed. Try your best not to itch the rash. Wash with warm soapy water twice daily.  Follow-up: Please return to the emergency department if you develop any new or worsening symptoms.

## 2017-08-25 ENCOUNTER — Emergency Department (HOSPITAL_COMMUNITY)
Admission: EM | Admit: 2017-08-25 | Discharge: 2017-08-25 | Disposition: A | Discharge status: Home / Self Care | Payer: Self-pay | Attending: Emergency Medicine | Admitting: Emergency Medicine

## 2017-08-25 ENCOUNTER — Encounter (HOSPITAL_COMMUNITY): Payer: Self-pay | Admitting: Emergency Medicine

## 2017-08-25 ENCOUNTER — Emergency Department (HOSPITAL_COMMUNITY): Payer: Self-pay

## 2017-08-25 DIAGNOSIS — M7918 Myalgia, other site: Secondary | ICD-10-CM | POA: Insufficient documentation

## 2017-08-25 DIAGNOSIS — J029 Acute pharyngitis, unspecified: Secondary | ICD-10-CM | POA: Insufficient documentation

## 2017-08-25 DIAGNOSIS — R0602 Shortness of breath: Secondary | ICD-10-CM

## 2017-08-25 DIAGNOSIS — R112 Nausea with vomiting, unspecified: Secondary | ICD-10-CM | POA: Insufficient documentation

## 2017-08-25 DIAGNOSIS — R0981 Nasal congestion: Secondary | ICD-10-CM | POA: Insufficient documentation

## 2017-08-25 DIAGNOSIS — B349 Viral infection, unspecified: Secondary | ICD-10-CM | POA: Insufficient documentation

## 2017-08-25 DIAGNOSIS — R197 Diarrhea, unspecified: Secondary | ICD-10-CM | POA: Insufficient documentation

## 2017-08-25 LAB — URINALYSIS, ROUTINE W REFLEX MICROSCOPIC
Bilirubin Urine: NEGATIVE
Glucose, UA: NEGATIVE mg/dL
Hgb urine dipstick: NEGATIVE
KETONES UR: 5 mg/dL — AB
Leukocytes, UA: NEGATIVE
Nitrite: NEGATIVE
PROTEIN: 30 mg/dL — AB
RBC / HPF: NONE SEEN RBC/hpf (ref 0–5)
Specific Gravity, Urine: 1.027 (ref 1.005–1.030)
pH: 6 (ref 5.0–8.0)

## 2017-08-25 LAB — CBC
HCT: 46.7 % (ref 39.0–52.0)
HEMOGLOBIN: 15.6 g/dL (ref 13.0–17.0)
MCH: 28.8 pg (ref 26.0–34.0)
MCHC: 33.4 g/dL (ref 30.0–36.0)
MCV: 86.3 fL (ref 78.0–100.0)
Platelets: 258 10*3/uL (ref 150–400)
RBC: 5.41 MIL/uL (ref 4.22–5.81)
RDW: 14.1 % (ref 11.5–15.5)
WBC: 8.6 10*3/uL (ref 4.0–10.5)

## 2017-08-25 LAB — COMPREHENSIVE METABOLIC PANEL
ALBUMIN: 4.9 g/dL (ref 3.5–5.0)
ALK PHOS: 66 U/L (ref 38–126)
ALT: 58 U/L (ref 17–63)
ANION GAP: 13 (ref 5–15)
AST: 75 U/L — ABNORMAL HIGH (ref 15–41)
BUN: 15 mg/dL (ref 6–20)
CALCIUM: 9.7 mg/dL (ref 8.9–10.3)
CO2: 28 mmol/L (ref 22–32)
Chloride: 100 mmol/L — ABNORMAL LOW (ref 101–111)
Creatinine, Ser: 1.34 mg/dL — ABNORMAL HIGH (ref 0.61–1.24)
GFR calc Af Amer: >60 mL/min (ref >60)
GFR calc non Af Amer: >60 mL/min (ref >60)
GLUCOSE: 113 mg/dL — AB (ref 65–99)
Potassium: 3.6 mmol/L (ref 3.5–5.1)
SODIUM: 141 mmol/L (ref 135–145)
Total Bilirubin: 0.6 mg/dL (ref 0.3–1.2)
Total Protein: 8.6 g/dL — ABNORMAL HIGH (ref 6.5–8.1)

## 2017-08-25 LAB — LIPASE, BLOOD: Lipase: 28 U/L (ref 11–51)

## 2017-08-25 MED ORDER — ONDANSETRON 4 MG PO TBDP: 4.0000 mg | ORAL_TABLET | Freq: Three times a day (TID) | ORAL | 0 refills | Status: AC | PRN

## 2017-08-25 MED ORDER — PREDNISONE 10 MG (21) PO TBPK: ORAL_TABLET | ORAL | 0 refills | Status: AC

## 2017-08-25 MED ORDER — SODIUM CHLORIDE 0.9 % IV SOLN: INTRAVENOUS | Status: DC

## 2017-08-25 MED ORDER — SODIUM CHLORIDE 0.9 % IV BOLUS (SEPSIS)
1000.0000 mL | Freq: Once | INTRAVENOUS | Status: AC
  Administered 2017-08-25: 1000 mL via INTRAVENOUS

## 2017-08-25 MED ORDER — BENZONATATE 100 MG PO CAPS: 100.0000 mg | ORAL_CAPSULE | Freq: Three times a day (TID) | ORAL | 0 refills | Status: AC

## 2017-08-25 MED ORDER — ALBUTEROL SULFATE HFA 108 (90 BASE) MCG/ACT IN AERS: 2.0000 | INHALATION_SPRAY | RESPIRATORY_TRACT | 0 refills | Status: AC | PRN

## 2017-08-25 NOTE — ED Provider Notes (Signed)
[facility]-EMERGENCY DEPT Provider Note   CSN: [id number] Arrival date & time: 08/25/17  0849     History   Chief Complaint Chief Complaint  Patient presents with  . Cough  . Emesis    HPI Nathan Garrett is a 47 y.o. male.  HPI   Nathan Garrett is a 47 y.o. male, patient with no pertinent past medical history, presenting to the ED with cough, congestion, body aches, nausea, vomiting, and diarrhea for the last 2 days.  Also complains of sore throat and body aches.  Pain 8/10. Has taken alka seltzer once, but no other therapies.  Denies fever/chills, shortness of breath, chest pain, rashes, abdominal pain, or any other complaints.    History reviewed. No pertinent past medical history.  There are no active problems to display for this patient.   History reviewed. No pertinent surgical history.     Home Medications    Prior to Admission medications   Medication Sig Start Date End Date Taking? Authorizing Provider  Chlorphen-Phenyleph-ASA (ALKA-SELTZER PLUS COLD PO) Take 2 tablets by mouth daily as needed.   Yes [provider]  Pseudoeph-Doxylamine-DM-APAP (NYQUIL PO) Take 15 mLs by mouth daily as needed.   Yes [provider]  albuterol (PROVENTIL HFA;VENTOLIN HFA) 108 (90 Base) MCG/ACT inhaler Inhale 2 puffs into the lungs every 4 (four) hours as needed for wheezing or shortness of breath. 08/25/17   Matilde Markie C, PA-C  benzonatate (TESSALON) 100 MG capsule Take 1 capsule (100 mg total) by mouth every 8 (eight) hours. 08/25/17   Valbona Slabach C, PA-C  cetirizine (ZYRTEC ALLERGY) 10 MG tablet Take 1 tablet (10 mg total) by mouth daily. Patient not taking: Reported on 08/25/2017 01/04/15   Everlene Farrier, PA-C  fluticasone [facility]) 50 MCG/ACT nasal spray Place 2 sprays into both nostrils daily. Patient not taking: Reported on 02/12/2016 01/04/15   Everlene Farrier, PA-C  hydrOXYzine (ATARAX/VISTARIL) 25 MG tablet Take 1 tablet (25 mg  total) by mouth every 6 (six) hours. Patient not taking: Reported on 08/25/2017 02/12/16   Emi Holes, PA-C  naproxen (NAPROSYN) 250 MG tablet Take 1 tablet (250 mg total) by mouth 2 (two) times daily with a meal. Patient not taking: Reported on 02/12/2016 01/04/15   Everlene Farrier, PA-C  ondansetron (ZOFRAN ODT) 4 MG disintegrating tablet Take 1 tablet (4 mg total) by mouth every 8 (eight) hours as needed for nausea or vomiting. 08/25/17   Dotsie Gillette C, PA-C  predniSONE (STERAPRED UNI-PAK 21 TAB) 10 MG (21) TBPK tablet Take 6 tabs (60mg ) on day 1, 5 tabs (50mg ) on day 2, 4 tabs (40mg ) on day 3, 3 tabs (30mg ) on day 4, 2 tabs (20mg ) on day 5, and 1 tab (10mg ) on day 6. 08/25/17   Dane Bloch, Hillard Danker, PA-C    Family History No family history on file.  Social History Social History   Tobacco Use  . Smoking status: Never Smoker  Substance Use Topics  . Alcohol use: Yes    Alcohol/week: 1.2 oz    Types: 2 Cans of beer per week  . Drug use: No     Allergies   Patient has no known allergies.   Review of Systems Review of Systems  Constitutional: Negative for chills, diaphoresis and fever.  HENT: Positive for congestion and sore throat. Negative for trouble swallowing and voice change.   Respiratory: Positive for cough. Negative for shortness of breath.   Cardiovascular: Negative for chest pain.  Gastrointestinal: Positive  for diarrhea, nausea and vomiting. Negative for abdominal pain.  Skin: Negative for rash.  Neurological: Negative for weakness.  All other systems reviewed and are negative.   Physical Exam Updated Vital Signs BP (!) 149/111   Pulse 92   Temp 99.9 F (37.7 C) (Oral)   Resp 15   SpO2 96%   Physical Exam  Constitutional: He appears well-developed and well-nourished. No distress.  HENT:  Head: Normocephalic and atraumatic.  Mouth/Throat: Uvula is midline. No uvula swelling. Posterior oropharyngeal erythema present. No oropharyngeal exudate or posterior  oropharyngeal edema.  Eyes: Conjunctivae are normal.  Neck: Normal range of motion. Neck supple.  Cardiovascular: Normal rate, regular rhythm, normal heart sounds and intact distal pulses.  Pulmonary/Chest: Effort normal and breath sounds normal. No respiratory distress. He exhibits tenderness.  Abdominal: Soft. There is no tenderness. There is no guarding.  Musculoskeletal: He exhibits no edema.  Lymphadenopathy:    He has no cervical adenopathy.  Neurological: He is alert.  Skin: Skin is warm and dry. He is not diaphoretic.  Psychiatric: He has a normal mood and affect. His behavior is normal.  Nursing note and vitals reviewed.    ED Treatments / Results  Labs (all labs ordered are listed, but only abnormal results are displayed) Labs Reviewed  COMPREHENSIVE METABOLIC PANEL - Abnormal; Notable for the following components:      Result Value   Chloride 100 (*)    Glucose, Bld 113 (*)    Creatinine, Ser 1.34 (*)    Total Protein 8.6 (*)    AST 75 (*)    All other components within normal limits  URINALYSIS, ROUTINE W REFLEX MICROSCOPIC - Abnormal; Notable for the following components:   Color, Urine AMBER (*)    Ketones, ur 5 (*)    Protein, ur 30 (*)    Bacteria, UA RARE (*)    Squamous Epithelial / LPF 0-5 (*)    All other components within normal limits  LIPASE, BLOOD  CBC    EKG  EKG Interpretation None       Radiology Dg Chest 2 View  Result Date: 08/25/2017 CLINICAL DATA:  Shortness of breath and chest pain. EXAM: CHEST  2 VIEW COMPARISON:  Chest x-ray dated January 04, 2015. FINDINGS: Mild cardiomegaly. Normal pulmonary vascularity. Right greater than left basilar atelectasis. No consolidation, pleural effusion, or pneumothorax. No acute osseous abnormality. IMPRESSION: Bibasilar atelectasis. Electronically Signed   By: Obie DredgeWilliam T Derry M.D.   On: 08/25/2017 14:42    Procedures Procedures (including critical care time)  Medications Ordered in ED Medications    sodium chloride 0.9 % bolus 1,000 mL (0 mLs Intravenous Stopped 08/25/17 1419)     Initial Impression / Assessment and Plan / ED Course  I have reviewed the triage vital signs and the nursing notes.  Pertinent labs & imaging results that were available during my care of the patient were reviewed by me and considered in my medical decision making (see chart for details).     Patient presents with symptoms consistent with viral syndrome. Patient is nontoxic appearing, afebrile, not tachycardic, not tachypneic, not hypotensive, maintains adequate SPO2 on room air, and is in no apparent distress.  PCP follow-up as needed.  Resources given. The patient was given instructions for home care as well as return precautions. Patient voices understanding of these instructions, accepts the plan, and is comfortable with discharge.  Vitals:   08/25/17 1010 08/25/17 1241 08/25/17 1417 08/25/17 1510  BP: (!) 150/107 Marland Kitchen(!)  149/111 (!) 161/93 (!) 158/88  Pulse: 98 92  90  Resp: 15 15  16   Temp: 99.9 F (37.7 C)  99.6 F (37.6 C) 99 F (37.2 C)  TempSrc: Oral  Oral Oral  SpO2: 96% 96%  97%     Final Clinical Impressions(s) / ED Diagnoses   Final diagnoses:  cough  Viral syndrome    ED Discharge Orders        Ordered    benzonatate (TESSALON) 100 MG capsule  Every 8 hours     08/25/17 1454    ondansetron (ZOFRAN ODT) 4 MG disintegrating tablet  Every 8 hours PRN     08/25/17 1454    predniSONE (STERAPRED UNI-PAK 21 TAB) 10 MG (21) TBPK tablet     08/25/17 1454    albuterol (PROVENTIL HFA;VENTOLIN HFA) 108 (90 Base) MCG/ACT inhaler  Every 4 hours PRN     08/25/17 1454       Anselm Pancoast, PA-C 08/25/17 2335    Benjiman Core, MD 08/26/17 [phone]

## 2017-08-25 NOTE — ED Triage Notes (Signed)
Patient c/o cough, congestion, body aches, diarrhea and emesis x2 days. Endorses being around others with similar symptoms.

## 2017-08-25 NOTE — ED Notes (Signed)
Pt had drawn for labs:  Gold Blue Lavender Lt green Dark green x2 

## 2017-08-25 NOTE — Discharge Instructions (Signed)
Your symptoms are consistent with a viral illness. Viruses do not require antibiotics. Treatment is symptomatic care and it is important to note that these symptoms may last for 7-14 days.   Hand washing: Wash your hands throughout the day, but especially before and after touching the face, using the restroom, sneezing, coughing, or touching surfaces that have been coughed or sneezed upon. Hydration: Symptoms will be intensified and complicated by dehydration. Dehydration can also extend the duration of symptoms. Drink plenty of fluids and get plenty of rest. You should be drinking at least half a liter of water an hour to stay hydrated. Electrolyte drinks (ex. Gatorade, Powerade, Pedialyte) are also encouraged. You should be drinking enough fluids to make your urine light yellow, almost clear. If this is not the case, you are not drinking enough water. Please note that some of the treatments indicated below will not be effective if you are not adequately hydrated. Diet: Please concentrate on hydration, however, you may introduce food slowly.  Start with a clear liquid diet, progressed to a full liquid diet, and then bland solids as you are able. Pain or fever: Ibuprofen, Naproxen, or Tylenol for pain or fever.  Nausea/vomiting: Use the Zofran for nausea or vomiting.  Cough: Use the Tessalon for cough.  Albuterol: May use the albuterol as needed for instances of shortness of breath. Prednisone: Take the prednisone, as directed, in its entirety. Zyrtec or Claritin: May add these medication daily to control underlying symptoms of congestion, sneezing, and other signs of allergies. Flonase: Use this medication, as directed, for nasal and sinus congestion. Congestion: Plain Mucinex may help relieve congestion. Saline sinus rinses and saline nasal sprays may also help relieve congestion. If you do not have heart problems or an allergy to such medications, you may also try phenylephrine or Sudafed. Sore  throat: Warm liquids or Chloraseptic spray may help soothe a sore throat. Gargle twice a day with a salt water solution made from a half teaspoon of salt in a cup of warm water.  Follow up: Follow up with a primary care provider, as needed, for any future management of this issue.

## 2019-06-08 IMAGING — CR DG CHEST 2V
2 series · 2 of 2 positions shown · non-contrast
Comparison: Chest x-ray dated January 04, 2015.

CLINICAL DATA: Shortness of breath and chest pain.

EXAM:
CHEST  2 VIEW

[w chest pa]
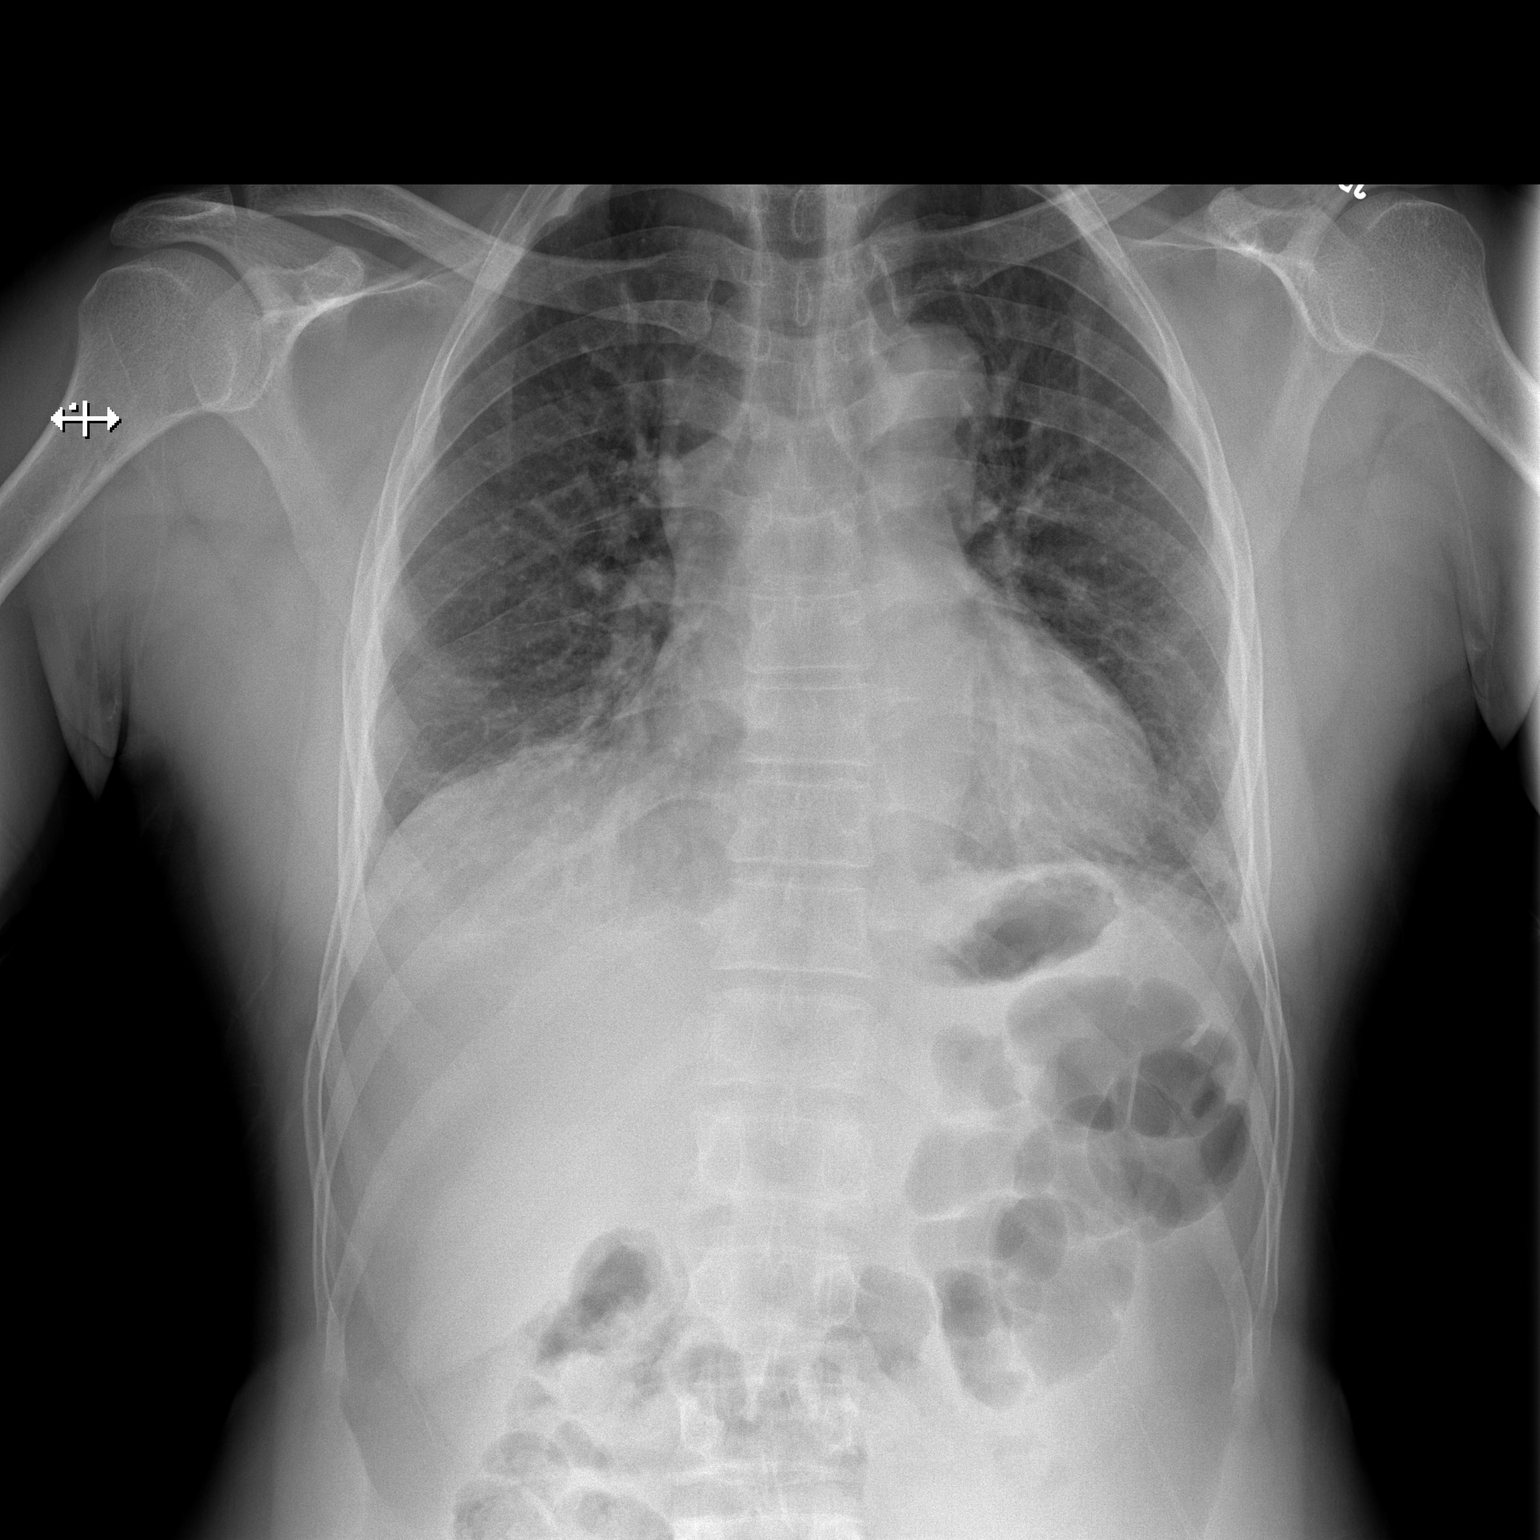

[w chest lat]
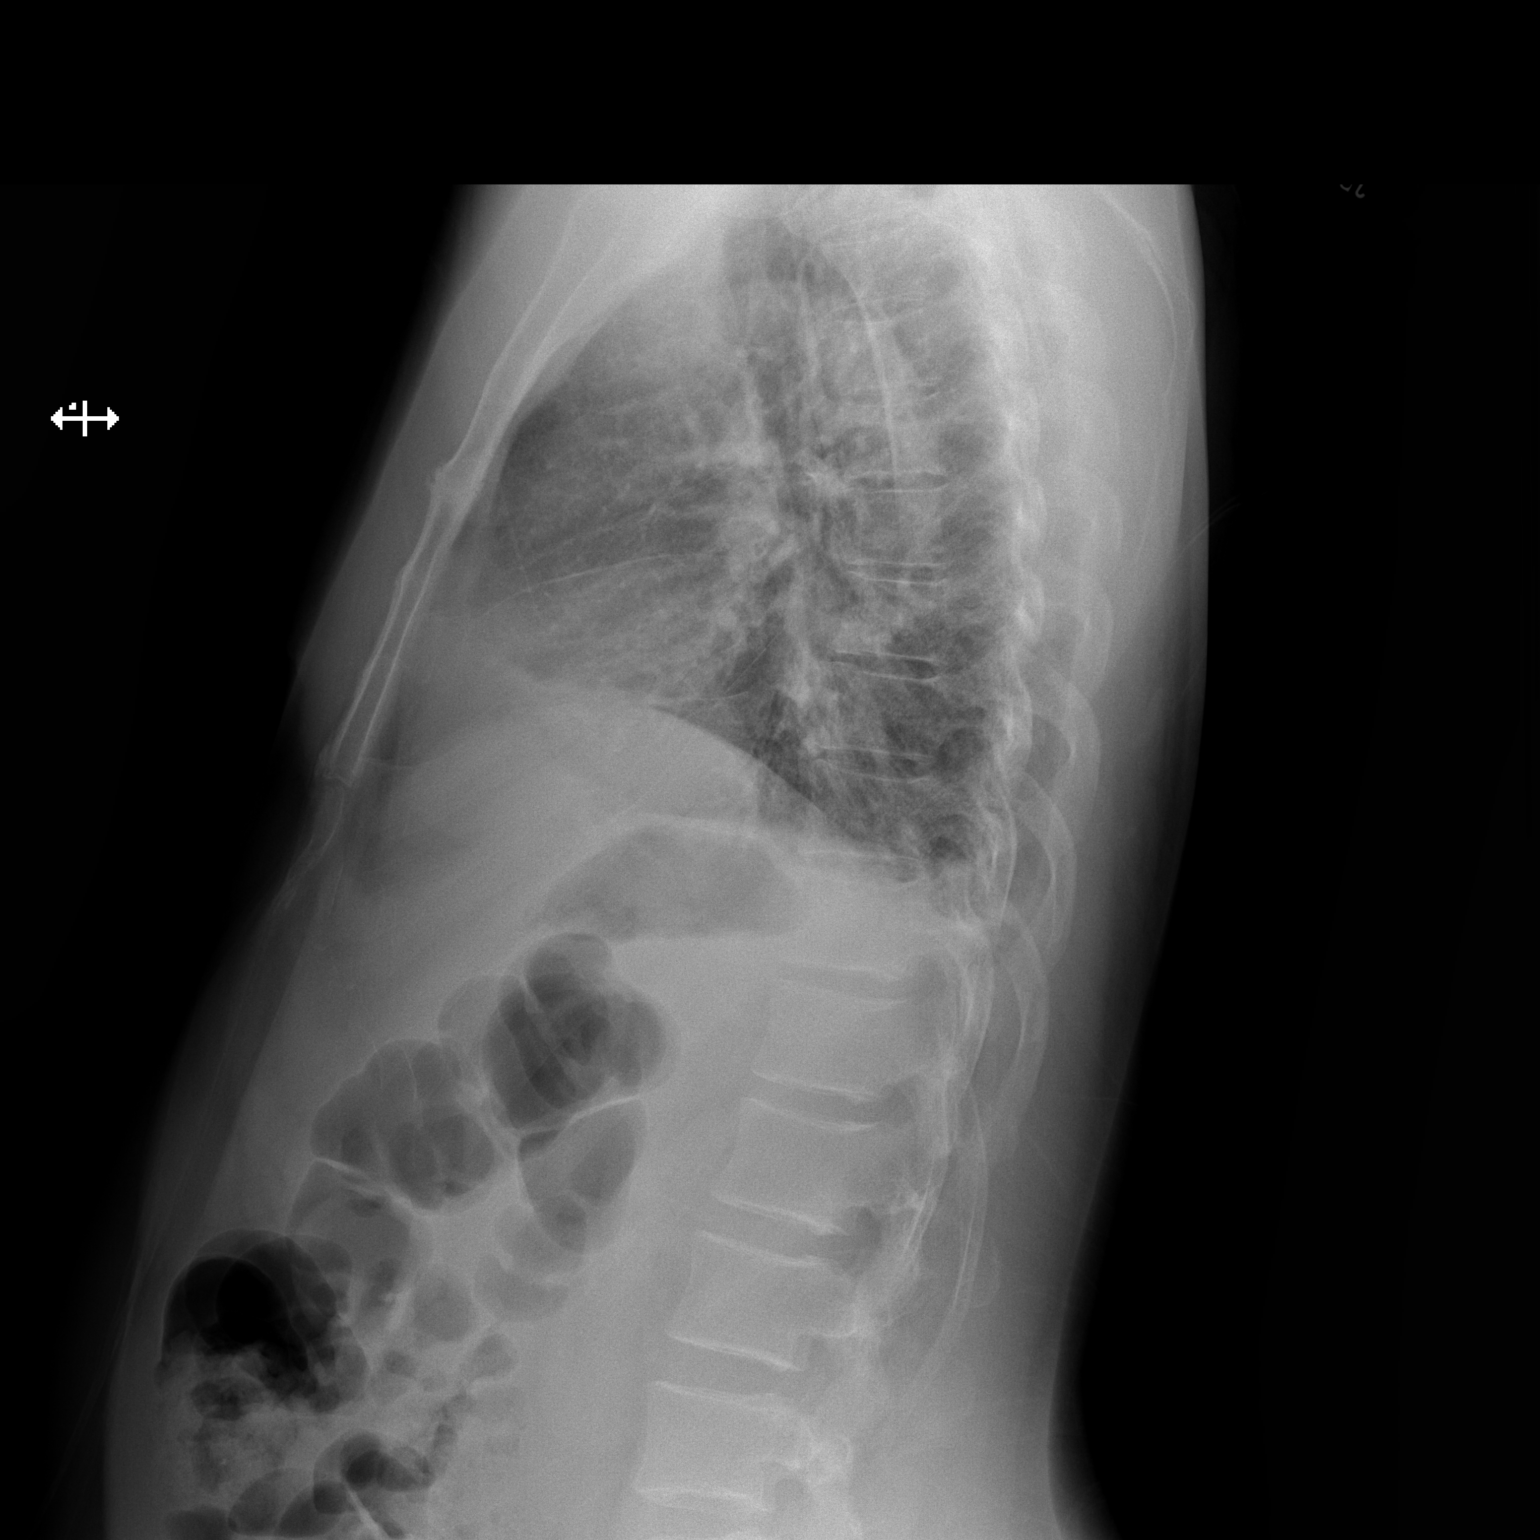

[2 of 2 positions shown; findings below may reference images not displayed]

FINDINGS: Mild cardiomegaly. Normal pulmonary vascularity. Right greater than
left basilar atelectasis. No consolidation, pleural effusion, or
pneumothorax. No acute osseous abnormality.
IMPRESSION: Bibasilar atelectasis.

## 2019-12-22 ENCOUNTER — Encounter (HOSPITAL_COMMUNITY): Payer: Self-pay

## 2019-12-22 ENCOUNTER — Other Ambulatory Visit: Payer: Self-pay

## 2019-12-22 ENCOUNTER — Ambulatory Visit (HOSPITAL_COMMUNITY)
Admission: EM | Admit: 2019-12-22 | Discharge: 2019-12-22 | Disposition: A | Discharge status: Home / Self Care | Payer: Self-pay | Attending: Urgent Care | Admitting: Urgent Care

## 2019-12-22 DIAGNOSIS — H5789 Other specified disorders of eye and adnexa: Secondary | ICD-10-CM

## 2019-12-22 DIAGNOSIS — H53142 Visual discomfort, left eye: Secondary | ICD-10-CM

## 2019-12-22 MED ORDER — TOBRAMYCIN-DEXAMETHASONE 0.3-0.1 % OP SUSP: 1.0000 [drp] | OPHTHALMIC | 0 refills | Status: AC

## 2019-12-22 NOTE — ED Provider Notes (Signed)
[place]   MRN: [id number] DOB: 23-Sep-1970  Subjective:   Nathan Garrett is a 49 y.o. male presenting for 1 month history of persistent left eye redness, photophobia, tearing.  Patient states that symptoms started after he was cutting the grass.  Denies eyelid pain, eyelid swelling, foreign body sensation.  Does not wear contacts.  Does not have an eye doctor.  No current facility-administered medications for this encounter.  Current Outpatient Medications:  .  albuterol (PROVENTIL HFA;VENTOLIN HFA) 108 (90 Base) MCG/ACT inhaler, Inhale 2 puffs into the lungs every 4 (four) hours as needed for wheezing or shortness of breath., Disp: 1 Inhaler, Rfl: 0 .  benzonatate (TESSALON) 100 MG capsule, Take 1 capsule (100 mg total) by mouth every 8 (eight) hours., Disp: 21 capsule, Rfl: 0 .  cetirizine (ZYRTEC ALLERGY) 10 MG tablet, Take 1 tablet (10 mg total) by mouth daily. (Patient not taking: Reported on 08/25/2017), Disp: 30 tablet, Rfl: 1 .  Chlorphen-Phenyleph-ASA (ALKA-SELTZER PLUS COLD PO), Take 2 tablets by mouth daily as needed., Disp: , Rfl:  .  fluticasone (FLONASE) 50 MCG/ACT nasal spray, Place 2 sprays into both nostrils daily. (Patient not taking: Reported on 02/12/2016), Disp: 16 g, Rfl: 1 .  hydrOXYzine (ATARAX/VISTARIL) 25 MG tablet, Take 1 tablet (25 mg total) by mouth every 6 (six) hours. (Patient not taking: Reported on 08/25/2017), Disp: 12 tablet, Rfl: 0 .  naproxen (NAPROSYN) 250 MG tablet, Take 1 tablet (250 mg total) by mouth 2 (two) times daily with a meal. (Patient not taking: Reported on 02/12/2016), Disp: 30 tablet, Rfl: 0 .  ondansetron (ZOFRAN ODT) 4 MG disintegrating tablet, Take 1 tablet (4 mg total) by mouth every 8 (eight) hours as needed for nausea or vomiting., Disp: 20 tablet, Rfl: 0 .  predniSONE (STERAPRED UNI-PAK 21 TAB) 10 MG (21) TBPK tablet, Take 6 tabs (60mg ) on day 1, 5 tabs (50mg ) on day 2, 4 tabs (40mg ) on day 3, 3 tabs (30mg ) on day 4, 2 tabs  (20mg ) on day 5, and 1 tab (10mg ) on day 6., Disp: 21 tablet, Rfl: 0 .  Pseudoeph-Doxylamine-DM-APAP (NYQUIL PO), Take 15 mLs by mouth daily as needed., Disp: , Rfl:    No Known Allergies  History reviewed. No pertinent past medical history.   History reviewed. No pertinent surgical history.  No family history on file.  Social History   Tobacco Use  . Smoking status: Never Smoker  Substance Use Topics  . Alcohol use: Yes    Alcohol/week: 2.0 standard drinks    Types: 2 Cans of beer per week  . Drug use: No    ROS   Objective:   Vitals: BP (!) 155/102   Pulse 78   Temp 98.4 F (36.9 C) (Oral)   Resp 16   Ht 5\' 2"  (1.575 m)   Wt 161 lb (73 kg)   SpO2 97%   BMI 29.45 kg/m   Physical Exam Constitutional:      General: He is not in acute distress.    Appearance: Normal appearance. He is well-developed and normal weight. He is not ill-appearing, toxic-appearing or diaphoretic.  HENT:     Head: Normocephalic and atraumatic.     Right Ear: External ear normal.     Left Ear: External ear normal.     Nose: Nose normal.     Mouth/Throat:     Pharynx: Oropharynx is clear.  Eyes:     General: Lids are everted, no foreign bodies appreciated. Vision grossly  intact. No scleral icterus.       Right eye: No discharge.        Left eye: Discharge (clear and watery) present.No foreign body or hordeolum.     Extraocular Movements: Extraocular movements intact.     Conjunctiva/sclera:     Left eye: Left conjunctiva is injected. No chemosis, exudate or hemorrhage.    Pupils: Pupils are equal, round, and reactive to light.     Comments: Photophobia of left eye.  Cardiovascular:     Rate and Rhythm: Normal rate.  Pulmonary:     Effort: Pulmonary effort is normal.  Musculoskeletal:     Cervical back: Normal range of motion.  Skin:    General: Skin is warm and dry.  Neurological:     Mental Status: He is alert and oriented to person, place, and time.  Psychiatric:         Mood and Affect: Mood normal.        Behavior: Behavior normal.        Thought Content: Thought content normal.        Judgment: Judgment normal.    Unfortunately, I was not able to get the tonopen to work.   Assessment and Plan :   PDMP not reviewed this encounter.  1. Eye redness   2. Photophobia of left eye     Counseled patient that differential includes glaucoma but unfortunately because I could not get the Tono-Pen to work would need to be evaluated first thing tomorrow with ophthalmology.  Information provided to him.  I prescribed him TobraDex in the event that he has an allergic conjunctivitis secondary to doing yard work.  Given the timeframe of 1 month history of symptoms and his visual acuity being reassuring, does not need emergent evaluation in the emergency room tonight.  Counseled patient on potential for adverse effects with medications prescribed/recommended today, ER and return-to-clinic precautions discussed, patient verbalized understanding.    Wallis Bamberg, PA-C 12/22/19 2051

## 2019-12-22 NOTE — ED Triage Notes (Signed)
Pt c/o light sensitivity to left eye, left eye won't produce tears, sore eye lid, red eye, blurred visionx1 mo. Pt denies injury to eye.
